# Patient Record
Sex: Female | Born: 1955 | Race: White | Hispanic: No | Marital: Married | Smoking: Never smoker
Health system: Southern US, Community
[De-identification: ages and names within clinical notes are randomized; demographics above are authoritative.]

## PROBLEM LIST (undated history)

## (undated) DIAGNOSIS — E079 Disorder of thyroid, unspecified: Secondary | ICD-10-CM

## (undated) DIAGNOSIS — I10 Essential (primary) hypertension: Secondary | ICD-10-CM

## (undated) DIAGNOSIS — E876 Hypokalemia: Secondary | ICD-10-CM

## (undated) DIAGNOSIS — M1990 Unspecified osteoarthritis, unspecified site: Secondary | ICD-10-CM

## (undated) DIAGNOSIS — K219 Gastro-esophageal reflux disease without esophagitis: Secondary | ICD-10-CM

## (undated) HISTORY — PX: CHOLECYSTECTOMY: SHX55

## (undated) HISTORY — PX: ABDOMINAL HYSTERECTOMY: SHX81

---

## 2012-11-08 ENCOUNTER — Emergency Department (HOSPITAL_BASED_OUTPATIENT_CLINIC_OR_DEPARTMENT_OTHER): Payer: 59

## 2012-11-08 ENCOUNTER — Encounter (HOSPITAL_BASED_OUTPATIENT_CLINIC_OR_DEPARTMENT_OTHER): Payer: Self-pay | Admitting: Emergency Medicine

## 2012-11-08 ENCOUNTER — Emergency Department (HOSPITAL_BASED_OUTPATIENT_CLINIC_OR_DEPARTMENT_OTHER)
Admission: EM | Admit: 2012-11-08 | Discharge: 2012-11-08 | Disposition: A | Discharge status: Home / Self Care | Payer: 59 | Attending: Emergency Medicine | Admitting: Emergency Medicine

## 2012-11-08 DIAGNOSIS — Z79899 Other long term (current) drug therapy: Secondary | ICD-10-CM | POA: Insufficient documentation

## 2012-11-08 DIAGNOSIS — S92109A Unspecified fracture of unspecified talus, initial encounter for closed fracture: Secondary | ICD-10-CM | POA: Insufficient documentation

## 2012-11-08 DIAGNOSIS — E079 Disorder of thyroid, unspecified: Secondary | ICD-10-CM | POA: Insufficient documentation

## 2012-11-08 DIAGNOSIS — I10 Essential (primary) hypertension: Secondary | ICD-10-CM | POA: Insufficient documentation

## 2012-11-08 DIAGNOSIS — X500XXA Overexertion from strenuous movement or load, initial encounter: Secondary | ICD-10-CM | POA: Insufficient documentation

## 2012-11-08 DIAGNOSIS — W010XXA Fall on same level from slipping, tripping and stumbling without subsequent striking against object, initial encounter: Secondary | ICD-10-CM | POA: Insufficient documentation

## 2012-11-08 DIAGNOSIS — Y9389 Activity, other specified: Secondary | ICD-10-CM | POA: Insufficient documentation

## 2012-11-08 DIAGNOSIS — S92101A Unspecified fracture of right talus, initial encounter for closed fracture: Secondary | ICD-10-CM

## 2012-11-08 DIAGNOSIS — M25473 Effusion, unspecified ankle: Secondary | ICD-10-CM | POA: Insufficient documentation

## 2012-11-08 DIAGNOSIS — Z862 Personal history of diseases of the blood and blood-forming organs and certain disorders involving the immune mechanism: Secondary | ICD-10-CM | POA: Insufficient documentation

## 2012-11-08 DIAGNOSIS — M25476 Effusion, unspecified foot: Secondary | ICD-10-CM | POA: Insufficient documentation

## 2012-11-08 DIAGNOSIS — Z8639 Personal history of other endocrine, nutritional and metabolic disease: Secondary | ICD-10-CM | POA: Insufficient documentation

## 2012-11-08 DIAGNOSIS — K219 Gastro-esophageal reflux disease without esophagitis: Secondary | ICD-10-CM | POA: Insufficient documentation

## 2012-11-08 DIAGNOSIS — Y929 Unspecified place or not applicable: Secondary | ICD-10-CM | POA: Insufficient documentation

## 2012-11-08 DIAGNOSIS — M25471 Effusion, right ankle: Secondary | ICD-10-CM

## 2012-11-08 DIAGNOSIS — Z791 Long term (current) use of non-steroidal anti-inflammatories (NSAID): Secondary | ICD-10-CM | POA: Insufficient documentation

## 2012-11-08 HISTORY — DX: Essential (primary) hypertension: I10

## 2012-11-08 HISTORY — DX: Gastro-esophageal reflux disease without esophagitis: K21.9

## 2012-11-08 HISTORY — DX: Hypokalemia: E87.6

## 2012-11-08 HISTORY — DX: Disorder of thyroid, unspecified: E07.9

## 2012-11-08 MED ORDER — OXYCODONE-ACETAMINOPHEN 5-325 MG PO TABS: 2.0000 | ORAL_TABLET | ORAL | Status: AC | PRN

## 2012-11-08 MED ORDER — OXYCODONE-ACETAMINOPHEN 5-325 MG PO TABS
1.0000 | ORAL_TABLET | Freq: Once | ORAL | Status: AC
  Administered 2012-11-08: 1 via ORAL
  Filled 2012-11-08 (×2): qty 1

## 2012-11-08 MED ORDER — MORPHINE SULFATE 4 MG/ML IJ SOLN: 4.0000 mg | Freq: Once | INTRAMUSCULAR | Status: DC

## 2012-11-08 NOTE — ED Provider Notes (Signed)
History     CSN: [id number]  Arrival date & time 11/08/12  1843   First MD Initiated Contact with Patient 11/08/12 1900      Chief Complaint  Patient presents with  . Fall    (Consider location/radiation/quality/duration/timing/severity/associated sxs/prior treatment) HPI Comments: Pt states that she slipped on the ice and twisted her right ankle:pt states that she is unable to put wt on that foot:pt denies previous injury to that area  Patient is a 57 y.o. female presenting with fall. The history is provided by the patient. No language interpreter was used.  Fall The accident occurred 3 to 5 hours ago. The fall occurred while standing. She landed on concrete. There was no blood loss. The pain is moderate. She was not ambulatory at the scene. There was no entrapment after the fall. There was no drug use involved in the accident. There was no alcohol use involved in the accident. Pertinent negatives include no bowel incontinence, no loss of consciousness and no tingling. The symptoms are aggravated by activity and standing. She has tried NSAIDs and ice for the symptoms. The treatment provided no relief.    Past Medical History  Diagnosis Date  . Hypertension   . GERD (gastroesophageal reflux disease)   . Thyroid disease   . Hypokalemia     Past Surgical History  Procedure Laterality Date  . Cholecystectomy    . Abdominal hysterectomy    . Cesarean section      No family history on file.  History  Substance Use Topics  . Smoking status: Never Smoker   . Smokeless tobacco: Not on file  . Alcohol Use: No    OB History   Grav Para Term Preterm Abortions TAB SAB Ect Mult Living                  Review of Systems  Constitutional: Negative.   Respiratory: Negative.   Cardiovascular: Negative.   Gastrointestinal: Negative for bowel incontinence.  Neurological: Negative for tingling and loss of consciousness.    Allergies  Review of patient's allergies indicates no  known allergies.  Home Medications   Current Outpatient Rx  Name  Route  Sig  Dispense  Refill  . bumetanide (BUMEX) 2 MG tablet   Oral   Take 2 mg by mouth daily.         Marland Kitchen levothyroxine (SYNTHROID, LEVOTHROID) 75 MCG tablet   Oral   Take 75 mcg by mouth daily.         . meloxicam (MOBIC) 7.5 MG tablet   Oral   Take 7.5 mg by mouth daily.         . pantoprazole (PROTONIX) 40 MG tablet   Oral   Take 40 mg by mouth daily.         . potassium chloride (K-DUR) 10 MEQ tablet   Oral   Take 10 mEq by mouth 3 (three) times daily.           BP 196/106  Pulse 76  Temp(Src) 98.1 F (36.7 C) (Oral)  Resp 18  Ht 5\' 6"  (1.676 m)  Wt 250 lb (113.399 kg)  BMI 40.37 kg/m2  SpO2 97%  Physical Exam  Nursing note and vitals reviewed. Constitutional: She is oriented to person, place, and time. She appears well-developed and well-nourished.  HENT:  Head: Normocephalic and atraumatic.  Eyes: Conjunctivae and EOM are normal. Pupils are equal, round, and reactive to light.  Neck: Normal range of motion. Neck supple.  Cardiovascular: Normal rate and regular rhythm.   Pulmonary/Chest: Effort normal and breath sounds normal.  Musculoskeletal: Normal range of motion.       Cervical back: Normal.       Thoracic back: Normal.       Lumbar back: Normal.  Large amount of swelling noted to the right ankle:pt has full EXB:MWUXLK intact:pt neurovascularly intact  Neurological: She is alert and oriented to person, place, and time. Coordination normal.  Skin: Skin is warm and dry.  Psychiatric: She has a normal mood and affect.    ED Course  Procedures (including critical care time)  Labs Reviewed - No data to display Dg Ankle Complete Right  11/08/2012  *RADIOLOGY REPORT*  Clinical Data: Fall, right ankle twisted  RIGHT ANKLE - COMPLETE 3+ VIEW  Comparison: None.  Findings: Diffuse soft tissue swelling about the ankle.  There is an osseous fragments on the frontal view which is  adjacent to the lateral process of the talus.  Additionally, there is an ankle joint effusion present.  On the lateral view, the lateral process of the talus is not well defined.  There is degenerative change in the midfoot at the talonavicular joint. Plantar calcaneal spurring at the insertion of the plantar fascia.  IMPRESSION:  Conventional radiographic findings are concerning for fracture of the lateral process of the talus.  Recommend further evaluation with CT scan of the ankle.   Original Report Authenticated By: Malachy Moan, M.D.    Ct Ankle Right Wo Contrast  11/08/2012  *RADIOLOGY REPORT*  Clinical Data: Status post fall; question of right ankle fracture on plain films.  CT OF THE RIGHT ANKLE WITH CONTRAST  Technique:  Multidetector CT imaging was performed following the standard protocol during bolus administration of intravenous contrast.  Comparison: None.  Findings: Thin osseous fragments about the lateral aspect of the talus likely reflect an acute avulsion fracture, with surrounding soft tissue edema.  Underlying ligamentous structures are not well assessed.  Soft tissue edema extends superiorly along the lateral malleolus. There are prominent osseous fragments along the distal aspect of the posterior tibial tendon, likely degenerative in nature.  These are relatively well corticated, without evidence of acute fracture. Significant surrounding soft tissue inflammation is seen.  No additional fractures are seen.  Fluid along the posterior aspect of the sinus tarsi likely reflects the ankle joint effusion.  There is no evidence of disruption of underlying vasculature, though it is difficult to fully assess.  The peroneus tendons, and remaining flexor and extensor tendons are grossly unremarkable in appearance. Visualized joint spaces are preserved.  Note is made of an ankle joint effusion.  IMPRESSION:  1.  Thin osseous fragments about the lateral aspect of the talus likely reflect an acute  avulsion fracture, with surrounding soft tissue edema.  Underlying ligamentous structures not well assessed. 2.  Prominent osseous fragments along the distal aspect of the posterior tibial tendon, likely degenerative in nature. Surrounding significant soft tissue inflammation seen, concerning for posterior tibial tendonitis. 3.  Ankle joint effusion noted. 4.  Soft tissue edema extends superiorly along the lateral malleolus.   Original Report Authenticated By: Tonia Ghent, M.D.      1. Talus fracture, right, closed, initial encounter   2. Ankle effusion, right       MDM  Pt placed in a posterior splint and crutches:pt is neurologically intact:pt is seen by [facility] and can follow up with them:will given oxycodone for pain  Teressa Lower, NP 11/08/12 2128

## 2012-11-08 NOTE — ED Notes (Signed)
Fell on ice in yard and twisted right ankle.  Ace wrap in place on arrival.

## 2012-11-08 NOTE — ED Notes (Signed)
Pt forgot to take her BP med this morning due to the change in routine with the power outage.  Had one with her so she took it now.

## 2012-11-08 NOTE — ED Provider Notes (Signed)
Medical screening examination/treatment/procedure(s) were performed by non-physician practitioner and as supervising physician I was immediately available for consultation/collaboration.   Rolan Bucco, MD 11/08/12 2235

## 2012-11-08 NOTE — ED Notes (Signed)
Patient transported to CT 

## 2013-12-26 IMAGING — CT CT ANKLE*R* W/O CM
3 series · 14 of 33 positions shown, 17 images · IV contrast (agent unspecified)
Comparison: None.

CLINICAL DATA: Status post fall; question of right ankle fracture
on plain films.

CT OF THE RIGHT ANKLE WITH CONTRAST
TECHNIQUE: Multidetector CT imaging was performed following the
standard protocol during bolus administration of intravenous
contrast.

[Series 4: foot/ankle 2.0 b31s · axial · 0.27mm/px · z∈[+126,+202]mm · 6 of 50 slices shown, 8 images]
[im 8/50  soft-tissue]
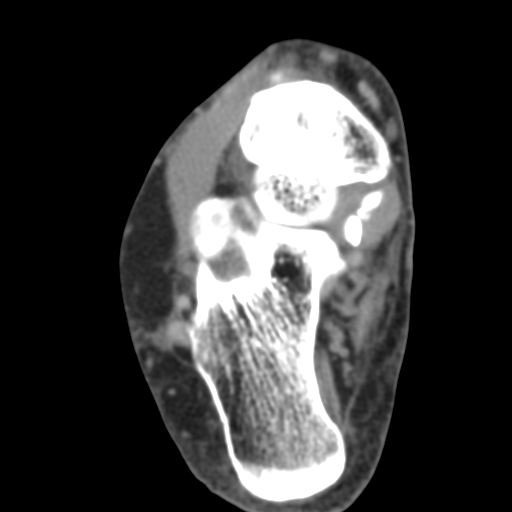
[im 8/50  bone]
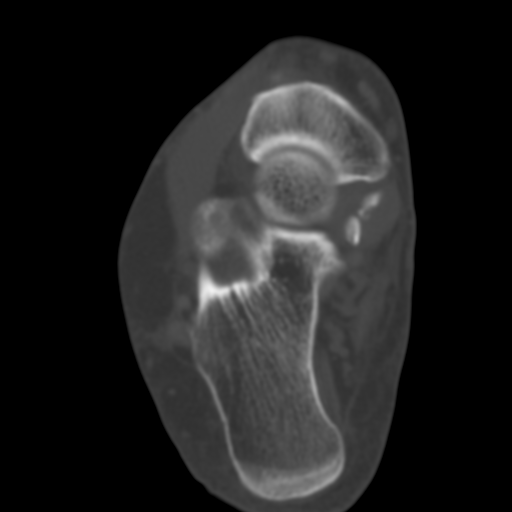
[im 16/50  bone]
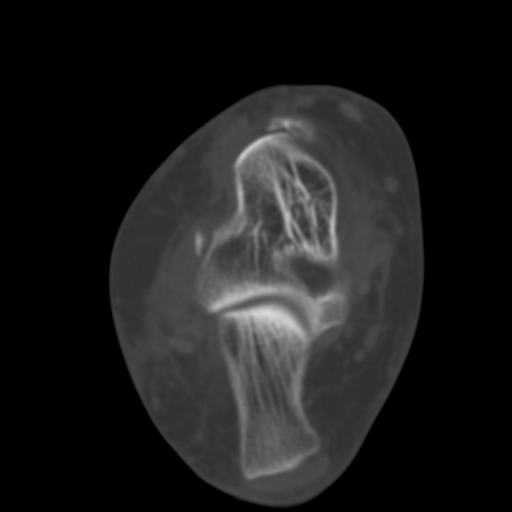
[im 23/50  bone]
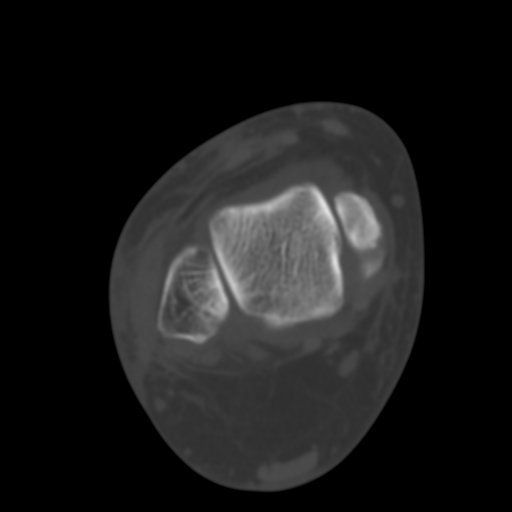
[im 31/50  bone]
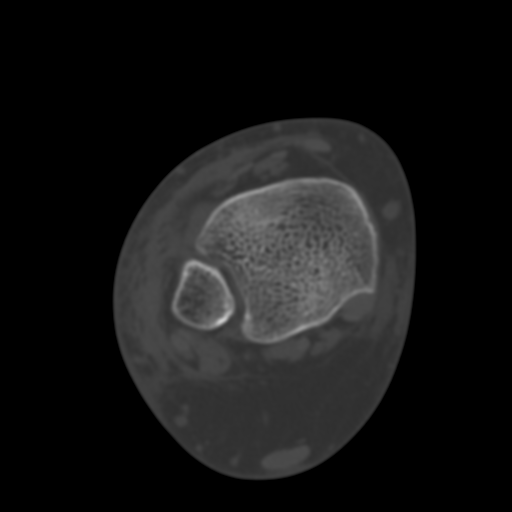
[im 38/50  soft-tissue]
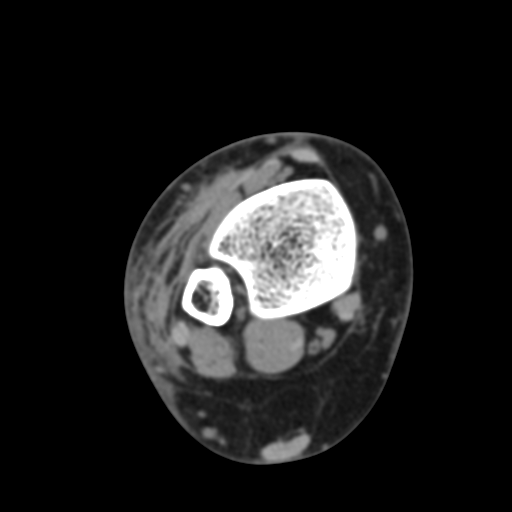
[im 38/50  bone]
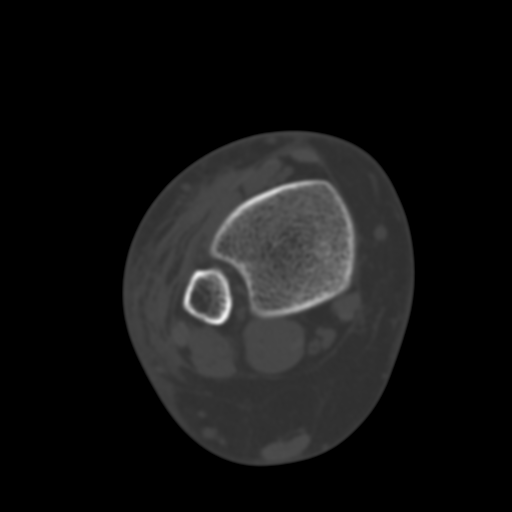
[im 46/50  bone]
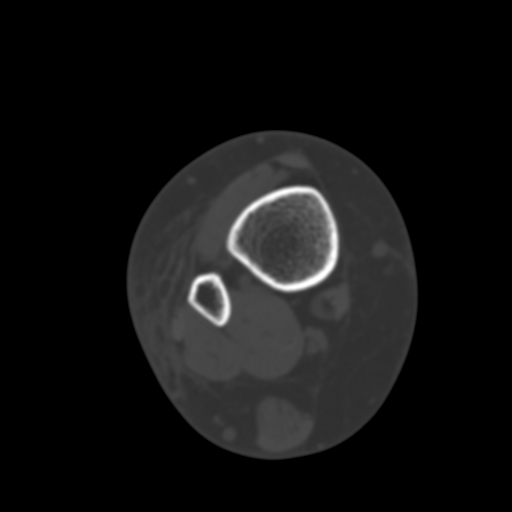

[Series 8: foot/ankle 2.0 coronal · coronal · 0.28mm/px · 3 of 66 slices shown]
[im 14/66  bone]
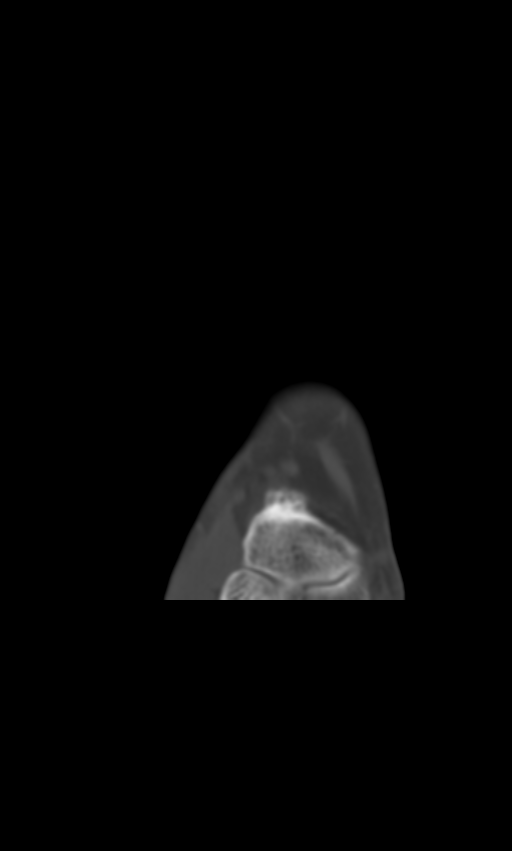
[im 27/66  bone]
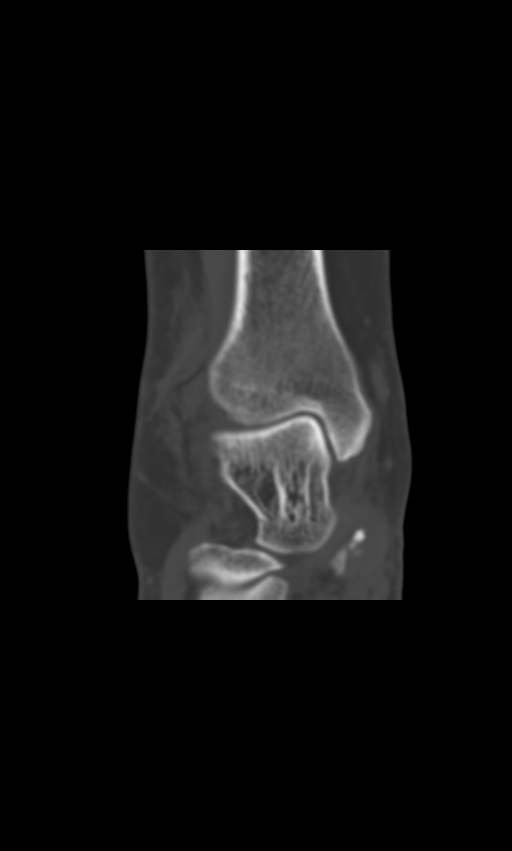
[im 40/66  bone]
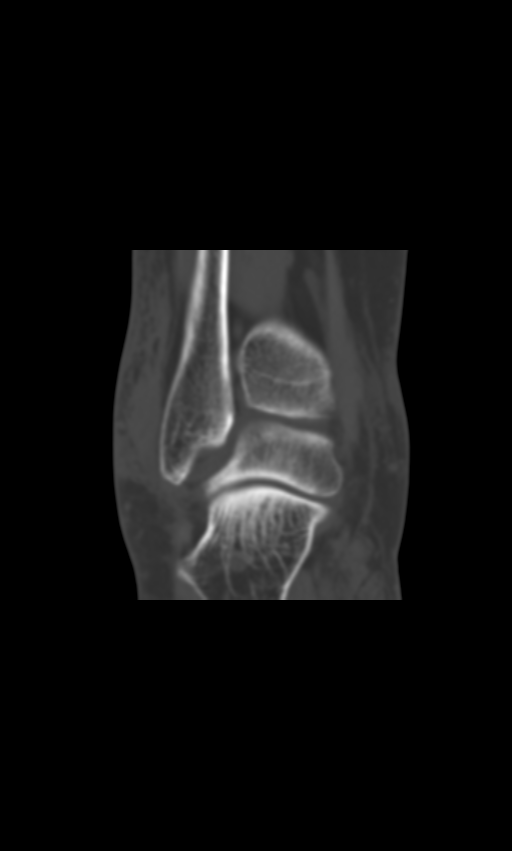

[Series 10: foot/ankle 2.0 sagittal · sagittal · 0.36mm/px · 5 of 39 slices shown, 6 images]
[im 13/39  bone]
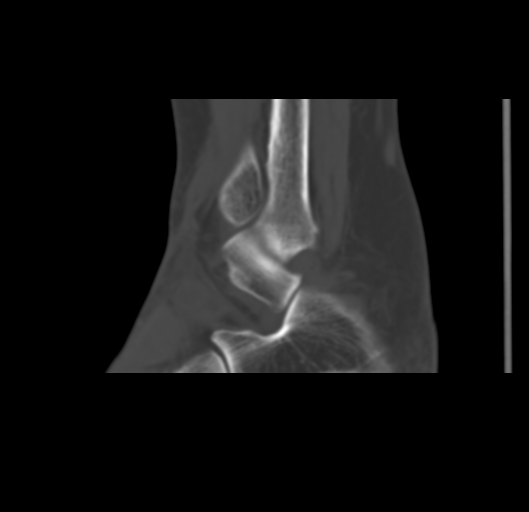
[im 16/39  bone]
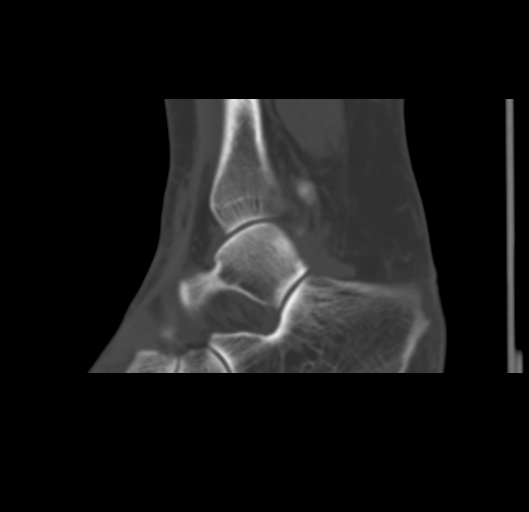
[im 20/39  soft-tissue]
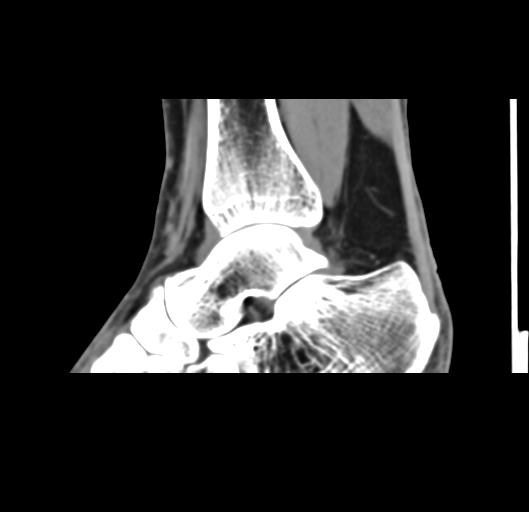
[im 20/39  bone]
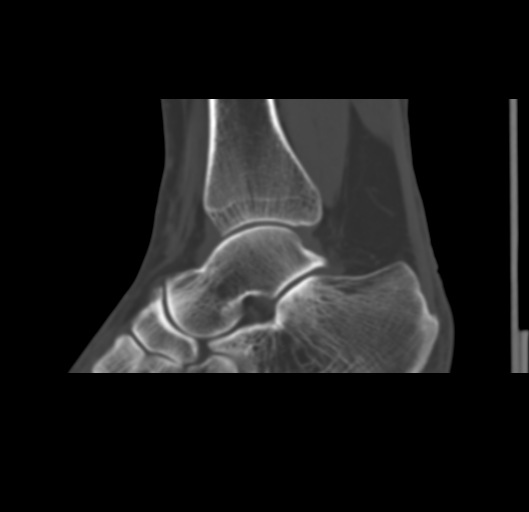
[im 23/39  bone]
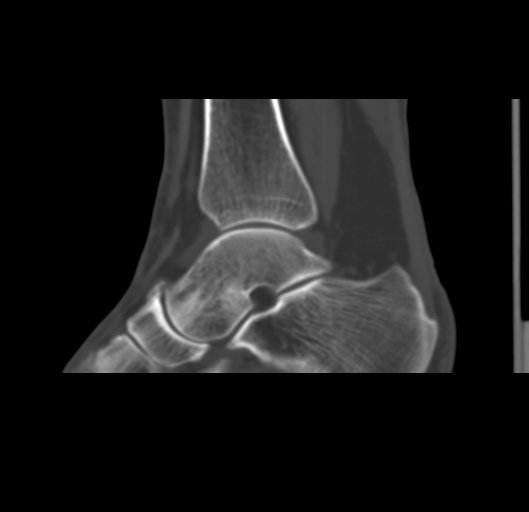
[im 26/39  bone]
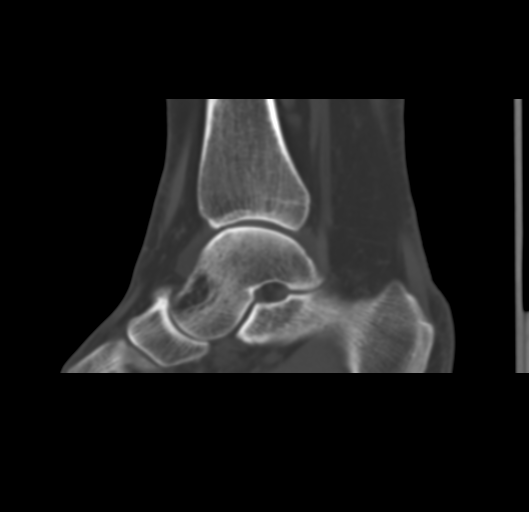

[14 of 33 positions shown; findings below may reference images not displayed]

FINDINGS: Thin osseous fragments about the lateral aspect of the
talus likely reflect an acute avulsion fracture, with surrounding
soft tissue edema.  Underlying ligamentous structures are not well
assessed.

Soft tissue edema extends superiorly along the lateral malleolus.
There are prominent osseous fragments along the distal aspect of
the posterior tibial tendon, likely degenerative in nature.  These
are relatively well corticated, without evidence of acute fracture.
Significant surrounding soft tissue inflammation is seen.

No additional fractures are seen.  Fluid along the posterior aspect
of the sinus tarsi likely reflects the ankle joint effusion.  There
is no evidence of disruption of underlying vasculature, though it
is difficult to fully assess.  The peroneus tendons, and remaining
flexor and extensor tendons are grossly unremarkable in appearance.
Visualized joint spaces are preserved.

Note is made of an ankle joint effusion.
IMPRESSION: 1.  Thin osseous fragments about the lateral aspect of the talus
likely reflect an acute avulsion fracture, with surrounding soft
tissue edema.  Underlying ligamentous structures not well assessed.
2.  Prominent osseous fragments along the distal aspect of the
posterior tibial tendon, likely degenerative in nature.
Surrounding significant soft tissue inflammation seen, concerning
for posterior tibial tendonitis.
3.  Ankle joint effusion noted.
4.  Soft tissue edema extends superiorly along the lateral
malleolus.

## 2020-06-13 ENCOUNTER — Encounter (HOSPITAL_BASED_OUTPATIENT_CLINIC_OR_DEPARTMENT_OTHER): Payer: Self-pay | Admitting: Emergency Medicine

## 2020-06-13 ENCOUNTER — Emergency Department (HOSPITAL_BASED_OUTPATIENT_CLINIC_OR_DEPARTMENT_OTHER)
Admission: EM | Admit: 2020-06-13 | Discharge: 2020-06-13 | Disposition: A | Discharge status: Home / Self Care | Payer: BC Managed Care – PPO | Attending: Emergency Medicine | Admitting: Emergency Medicine

## 2020-06-13 ENCOUNTER — Emergency Department (HOSPITAL_BASED_OUTPATIENT_CLINIC_OR_DEPARTMENT_OTHER): Payer: BC Managed Care – PPO

## 2020-06-13 ENCOUNTER — Other Ambulatory Visit: Payer: Self-pay

## 2020-06-13 DIAGNOSIS — R079 Chest pain, unspecified: Secondary | ICD-10-CM | POA: Insufficient documentation

## 2020-06-13 DIAGNOSIS — Z79899 Other long term (current) drug therapy: Secondary | ICD-10-CM | POA: Insufficient documentation

## 2020-06-13 DIAGNOSIS — I10 Essential (primary) hypertension: Secondary | ICD-10-CM | POA: Insufficient documentation

## 2020-06-13 HISTORY — DX: Unspecified osteoarthritis, unspecified site: M19.90

## 2020-06-13 LAB — CBC WITH DIFFERENTIAL/PLATELET
Abs Immature Granulocytes: 0.02 10*3/uL (ref 0.00–0.07)
Basophils Absolute: 0.1 10*3/uL (ref 0.0–0.1)
Basophils Relative: 1 %
Eosinophils Absolute: 0.2 10*3/uL (ref 0.0–0.5)
Eosinophils Relative: 3 %
HCT: 42 % (ref 36.0–46.0)
Hemoglobin: 13.9 g/dL (ref 12.0–15.0)
Immature Granulocytes: 0 %
Lymphocytes Relative: 34 %
Lymphs Abs: 2.1 10*3/uL (ref 0.7–4.0)
MCH: 28.7 pg (ref 26.0–34.0)
MCHC: 33.1 g/dL (ref 30.0–36.0)
MCV: 86.8 fL (ref 80.0–100.0)
Monocytes Absolute: 0.8 10*3/uL (ref 0.1–1.0)
Monocytes Relative: 12 %
Neutro Abs: 3.2 10*3/uL (ref 1.7–7.7)
Neutrophils Relative %: 50 %
Platelets: 239 10*3/uL (ref 150–400)
RBC: 4.84 MIL/uL (ref 3.87–5.11)
RDW: 13.3 % (ref 11.5–15.5)
WBC: 6.3 10*3/uL (ref 4.0–10.5)
nRBC: 0 % (ref 0.0–0.2)

## 2020-06-13 LAB — BASIC METABOLIC PANEL
Anion gap: 12 (ref 5–15)
BUN: 24 mg/dL — ABNORMAL HIGH (ref 8–23)
CO2: 25 mmol/L (ref 22–32)
Calcium: 9.3 mg/dL (ref 8.9–10.3)
Chloride: 104 mmol/L (ref 98–111)
Creatinine, Ser: 1.29 mg/dL — ABNORMAL HIGH (ref 0.44–1.00)
GFR, Estimated: 44 mL/min — ABNORMAL LOW (ref >60)
Glucose, Bld: 106 mg/dL — ABNORMAL HIGH (ref 70–99)
Potassium: 3.9 mmol/L (ref 3.5–5.1)
Sodium: 141 mmol/L (ref 135–145)

## 2020-06-13 LAB — TROPONIN I (HIGH SENSITIVITY)
Troponin I (High Sensitivity): 3 ng/L (ref <18)
Troponin I (High Sensitivity): 3 ng/L (ref <18)

## 2020-06-13 NOTE — ED Notes (Signed)
Ambulated to bathroom with steady gait, denies dizziness. 

## 2020-06-13 NOTE — ED Triage Notes (Signed)
Intermittent L chest pain for a few months. Her son made her come today.

## 2020-06-13 NOTE — ED Provider Notes (Signed)
[facility] EMERGENCY DEPARTMENT Provider Note   CSN: [id number] Arrival date & time: 06/13/20  1621     History Chief Complaint  Patient presents with  . Chest Pain    Nancy Wilcox is a 64 y.o. female.  Presents to ER with concern for chest pain.  She reports that over the last few months she has had occasional episodes of chest pain, normally last for couple hours, resolved previously.  Today had an episode of pain started few hours ago, relatively constant, mild to moderate, central, left-sided nonradiating, no alleviating or aggravating factors.  Not associated with exertion  Reports that she had a stress test a few years ago that was normal.  Through care everywhere reviewed echo performed in 2020, no wall motion abnormalities, normal EF.  HPI     Past Medical History:  Diagnosis Date  . Arthritis   . GERD (gastroesophageal reflux disease)   . Hypertension   . Hypokalemia   . Thyroid disease     There are no problems to display for this patient.   Past Surgical History:  Procedure Laterality Date  . ABDOMINAL HYSTERECTOMY    . CESAREAN SECTION    . CHOLECYSTECTOMY       OB History   No obstetric history on file.      No family history on file.  Social History   Tobacco Use  . Smoking status: Never Smoker  . Smokeless tobacco: Never Used  Substance Use Topics  . Alcohol use: No  . Drug use: No    Home Medications Prior to Admission medications   Medication Sig Start Date End Date Taking? Authorizing Provider  bumetanide (BUMEX) 2 MG tablet Take 2 mg by mouth daily.    [provider]  levothyroxine (SYNTHROID, LEVOTHROID) 75 MCG tablet Take 75 mcg by mouth daily.    [provider]  meloxicam (MOBIC) 7.5 MG tablet Take 7.5 mg by mouth daily.    [provider]  oxyCODONE-acetaminophen (PERCOCET/ROXICET) 5-325 MG per tablet Take 2 tablets by mouth every 4 (four) hours as needed for pain. 11/08/12   Teressa Lower, NP  pantoprazole (PROTONIX) 40 MG tablet Take 40 mg by mouth daily.    [provider]  potassium chloride (K-DUR) 10 MEQ tablet Take 10 mEq by mouth 3 (three) times daily.    [provider]    Allergies    Clonidine derivatives and Demerol [meperidine]  Review of Systems   Review of Systems  Constitutional: Negative for chills and fever.  HENT: Negative for ear pain and sore throat.   Eyes: Negative for pain and visual disturbance.  Respiratory: Negative for cough and shortness of breath.   Cardiovascular: Positive for chest pain. Negative for palpitations.  Gastrointestinal: Negative for abdominal pain and vomiting.  Genitourinary: Negative for dysuria and hematuria.  Musculoskeletal: Negative for arthralgias and back pain.  Skin: Negative for color change and rash.  Neurological: Negative for seizures and syncope.  All other systems reviewed and are negative.   Physical Exam Updated Vital Signs BP (!) 144/81   Pulse 62   Temp 98.6 F (37 C) (Oral)   Resp 20   Ht 5\' 6"  (1.676 m)   Wt 121.6 kg   SpO2 96%   BMI 43.26 kg/m    Physical Exam Vitals and nursing note reviewed.  Constitutional:      General: She is not in acute distress.    Appearance: She is well-developed.  HENT:  Head: Normocephalic and atraumatic.  Eyes:     Conjunctiva/sclera: Conjunctivae normal.  Cardiovascular:     Rate and Rhythm: Normal rate and regular rhythm.     Heart sounds: No murmur heard.   Pulmonary:     Effort: Pulmonary effort is normal. No respiratory distress.     Breath sounds: Normal breath sounds.  Chest:     Chest wall: No tenderness or crepitus.  Abdominal:     Palpations: Abdomen is soft.     Tenderness: There is no abdominal tenderness.  Musculoskeletal:     Cervical back: Neck supple.     Right lower leg: No edema.     Left lower leg: No edema.  Skin:    General: Skin is warm and dry.     Capillary Refill: Capillary refill takes less  than 2 seconds.  Neurological:     General: No focal deficit present.     Mental Status: She is alert.     ED Results / Procedures / Treatments   Labs (all labs ordered are listed, but only abnormal results are displayed) Labs Reviewed  BASIC METABOLIC PANEL - Abnormal; Notable for the following components:      Result Value   Glucose, Bld 106 (*)    BUN 24 (*)    Creatinine, Ser 1.29 (*)    GFR, Estimated 44 (*)    All other components within normal limits  CBC WITH DIFFERENTIAL/PLATELET  TROPONIN I (HIGH SENSITIVITY)  TROPONIN I (HIGH SENSITIVITY)    EKG EKG Interpretation  Date/Time:  Sunday June 13 2020 16:29:55 EDT Ventricular Rate:  67 PR Interval:    QRS Duration: 104 QT Interval:  413 QTC Calculation: 436 R Axis:   34 Text Interpretation: Sinus rhythm Abnormal R-wave progression, early transition Confirmed by Liliya Fullenwider ([id number]) on 06/13/2020 4:40:26 PM   Radiology DG Chest 2 View  Result Date: 06/13/2020 CLINICAL DATA:  63 year old female with shortness of breath. EXAM: CHEST - 2 VIEW COMPARISON:  Chest radiograph dated 01/24/2019. FINDINGS: The heart size and mediastinal contours are within normal limits. Both lungs are clear. The visualized skeletal structures are unremarkable. IMPRESSION: No active cardiopulmonary disease. Electronically Signed   By: Arash  Radparvar M.D.   On: 06/13/2020 18:05    Procedures Procedures (including critical care time)  Medications Ordered in ED Medications - No data to display  ED Course  I have reviewed the triage vital signs and the nursing notes.  Pertinent labs & imaging results that were available during my care of the patient were reviewed by me and considered in my medical decision making (see chart for details).    MDM Rules/Calculators/A&P                          63  year old lady presents to ER with concern for chest pain.  On physical exam, patient noted to be remarkably well-appearing in no  distress.  Her EKG does not have acute ischemic changes, troponin x2 within normal limits.  Based on description of symptoms and these findings, I have a very low suspicion for ACS.  Chest x-ray negative.  Remainder of labs are within normal limits.  Given her current appearance and reassuring work-up today, believe patient can follow-up with her primary doctor.  Recommended close recheck with her primary, return to ER for worsening symptoms.   After the discussed management above, the patient was determined to be safe for discharge.  The patient was in agreement  with this plan and all questions regarding their care were answered.  ED return precautions were discussed and the patient will return to the ED with any significant worsening of condition.    Final Clinical Impression(s) / ED Diagnoses Final diagnoses:  Chest pain, unspecified type    Rx / DC Orders ED Discharge Orders    None       Milagros Loll, MD 06/13/20 1942

## 2020-06-13 NOTE — Discharge Instructions (Signed)
Follow-up with your primary doctor regarding symptoms from today.  Please discuss further testing, referral with your primary doctor.  If you have recurrent or worsening chest pain, difficulty breathing or other new concerning symptom, return to ER immediately for reassessment.
